# Patient Record
Sex: Female | Born: 2000 | Race: White | Hispanic: Yes | Marital: Single
Health system: Midwestern US, Community
[De-identification: ages and names within clinical notes are randomized; demographics above are authoritative.]

## PROBLEM LIST (undated history)

## (undated) DIAGNOSIS — F32A Depression, unspecified: Secondary | ICD-10-CM

## (undated) DIAGNOSIS — F419 Anxiety disorder, unspecified: Secondary | ICD-10-CM

## (undated) DIAGNOSIS — F909 Attention-deficit hyperactivity disorder, unspecified type: Secondary | ICD-10-CM

## (undated) DIAGNOSIS — J4520 Mild intermittent asthma, uncomplicated: Secondary | ICD-10-CM

## (undated) HISTORY — DX: Anxiety disorder, unspecified: F41.9

## (undated) HISTORY — PX: WISDOM TOOTH EXTRACTION: SHX21

## (undated) HISTORY — PX: TONSILLECTOMY: SUR1361

## (undated) MED ORDER — ALBUTEROL SULFATE HFA 90 MCG/ACTUATION AEROSOL INHALER
90 mcg/actuation | RESPIRATORY_TRACT | Status: DC | PRN
Start: ? — End: 2012-11-18

## (undated) MED ORDER — ALBUTEROL SULFATE HFA 90 MCG/ACTUATION AEROSOL INHALER
90 mcg/actuation | RESPIRATORY_TRACT | Status: DC | PRN
Start: ? — End: 2013-08-11

---
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
## 2022-02-20 DIAGNOSIS — R109 Unspecified abdominal pain: Secondary | ICD-10-CM | POA: Diagnosis not present

## 2022-02-27 ENCOUNTER — Ambulatory Visit: Payer: Medicaid Other | Admitting: Family Medicine

## 2022-03-07 ENCOUNTER — Ambulatory Visit: Payer: Medicaid Other | Admitting: Family Medicine

## 2022-04-07 ENCOUNTER — Encounter: Payer: Self-pay | Admitting: Family Medicine

## 2022-04-07 ENCOUNTER — Ambulatory Visit: Payer: BC Managed Care – PPO | Admitting: Family Medicine

## 2022-04-07 VITALS — BP 107/68 | HR 81 | Temp 97.5°F | Resp 20 | Ht 67.0 in | Wt 228.0 lb

## 2022-04-07 DIAGNOSIS — M25561 Pain in right knee: Secondary | ICD-10-CM

## 2022-04-07 DIAGNOSIS — B070 Plantar wart: Secondary | ICD-10-CM

## 2022-04-07 DIAGNOSIS — R109 Unspecified abdominal pain: Secondary | ICD-10-CM | POA: Diagnosis not present

## 2022-04-07 MED ORDER — NAPROXEN 375 MG PO TBEC: 375.0000 mg | DELAYED_RELEASE_TABLET | Freq: Two times a day (BID) | ORAL | 2 refills | Status: DC | PRN

## 2022-04-07 NOTE — Progress Notes (Signed)
Subjective: CC: Follow-up GI cramping PCP: Raliegh Ip, DO Kari Randall is a 21 y.o. female presenting to clinic today for:  1.  Abdominal cramping Patient has seen gastroenterology.  She was treated with Bentyl and fiber and advised to return to clinic in 8 weeks.  She apparently missed her appointment but has rescheduled.  Notes total resolution of cramping and change in bowel habits.  2.  Knee pain Patient reports that she has been experiencing some intermittent knee pain on the right side.  This seemed to get worse after she started having to work a new loop at regular.  Pivoting motions tend to aggravate it.  She used a brace but it keeps rolling down.  Also applied Voltaren gel but did not find that to be especially helpful.  Has not tried the Naprosyn that she was prescribed for migraines because she left that medicine at home.  Occasionally pops but does not lock.  No giving way or weakness  3.  Foot pain Patient reports a spot on her right foot on the bottom.  This has been there for over 6 months.  She thought she in fact had a splinter of some sort so tried to take something out but noted that it never really resolved and has this raised area now.   ROS: Per HPI  Allergies  Allergen Reactions   Amitriptyline Rash        Amoxicillin Diarrhea and Rash   Past Medical History:  Diagnosis Date   Anxiety    Attention deficit hyperactivity disorder (ADHD)    Depression     Current Outpatient Medications:    diclofenac Sodium (VOLTAREN) 1 % GEL, Apply 4 g topically 4 (four) times daily as needed (knee pain)., Disp: 300 g, Rfl: 2   levocetirizine (XYZAL) 5 MG tablet, Take 1 tablet (5 mg total) by mouth every evening. For allergies/ ears, Disp: 90 tablet, Rfl: 3   medroxyPROGESTERone (DEPO-PROVERA) 150 MG/ML injection, Inject 150 mg into the muscle every 3 (three) months., Disp: , Rfl:    Naproxen 375 MG TBEC, Take 1 tablet (375 mg total) by mouth 2 (two) times  daily as needed (migraine headache)., Disp: 60 tablet, Rfl: 0   ondansetron (ZOFRAN-ODT) 4 MG disintegrating tablet, Take 1 tablet (4 mg total) by mouth every 8 (eight) hours as needed for nausea or vomiting. (Patient not taking: Reported on 04/07/2022), Disp: 20 tablet, Rfl: 0 Social History   Socioeconomic History   Marital status: Single    Spouse name: Not on file   Number of children: Not on file   Years of education: Not on file   Highest education level: Not on file  Occupational History   Not on file  Tobacco Use   Smoking status: Never    Passive exposure: Yes   Smokeless tobacco: Never   Tobacco comments:    1-2 cigarettes a day   Vaping Use   Vaping Use: Former   Substances: Nicotine  Substance and Sexual Activity   Alcohol use: No   Drug use: Not Currently    Types: Marijuana    Comment: last smoked- before preg   Sexual activity: Not Currently    Birth control/protection: None  Other Topics Concern   Not on file  Social History Narrative   Not on file   Social Determinants of Health   Financial Resource Strain: Low Risk  (08/04/2019)   Overall Financial Resource Strain (CARDIA)    Difficulty of Paying Living  Expenses: Not very hard  Food Insecurity: No Food Insecurity (08/04/2019)   Hunger Vital Sign    Worried About Running Out of Food in the Last Year: Never true    Ran Out of Food in the Last Year: Never true  Transportation Needs: No Transportation Needs (08/04/2019)   PRAPARE - Administrator, Civil Service (Medical): No    Lack of Transportation (Non-Medical): No  Physical Activity: Sufficiently Active (08/04/2019)   Exercise Vital Sign    Days of Exercise per Week: 5 days    Minutes of Exercise per Session: 30 min  Stress: No Stress Concern Present (08/04/2019)   [employer] of Occupational Health - Occupational Stress Questionnaire    Feeling of Stress : Only a little  Social Connections: Moderately Isolated (08/04/2019)   Social  Connection and Isolation Panel [NHANES]    Frequency of Communication with Friends and Family: More than three times a week    Frequency of Social Gatherings with Friends and Family: Twice a week    Attends Religious Services: 1 to 4 times per year    Active Member of [employer] or Organizations: No    Attends Banker Meetings: Never    Marital Status: Never married  Intimate Partner Violence: Not At Risk (08/04/2019)   Humiliation, Afraid, Rape, and Kick questionnaire    Fear of Current or Ex-Partner: No    Emotionally Abused: No    Physically Abused: No    Sexually Abused: No   Family History  Problem Relation Age of Onset   Thyroid disease Mother    Anemia Mother     Objective: Office vital signs reviewed. BP 107/68   Pulse 81   Temp (!) 97.5 F (36.4 C) (Temporal)   Resp 20   Ht 5\' 7"  (1.702 m)   Wt 228 lb (103.4 kg)   SpO2 96%   BMI 35.71 kg/m   Physical Examination:  General: Awake, alert, well nourished, No acute distress HEENT: sclera white, MMM MSK: Ambulating with normal gait and station.  No tenderness palpation to the patellar tendon, patella, joint line, posterior popliteal fossa.  No appreciable joint effusion or erythema or warmth. Skin: Plantar wart noted along the right plantar space at just below the third MTP  Cryotherapy Procedure:  Risks and benefits of procedure were reviewed with the patient.  Written consent obtained and scanned into the chart.  Lesion of concern was identified and located on right foot.  Liquid nitrogen was applied to area of concern and extending out 1 millimeters beyond the border of the lesion.  Treated area was allowed to come back to room temperature before treating it a second time.  Patient tolerated procedure well and there were no immediate complications.  Home care instructions were reviewed with the patient and a handout was provided.  Assessment/ Plan: 21 y.o. female   Plantar wart of right foot  Acute pain  of right knee - Plan: Naproxen 375 MG TBEC  Abdominal cramping  Plantar wart treated with cryoablation.  Home care instructions reviewed and reasons for reevaluation discussed.  Follow-up as needed  Suspect that the pain in the knee is secondary to either tendinitis or maybe even some meniscal irritation.  It seems to be worse with pivoting so I suspect the latter.  Will give her Naprosyn to use up to twice daily as needed.  Use ice to the affected area.  Okay to use brace.  Follow-up as needed  Abdominal cramping has  resolved status post treatment by GI.  Will CC chart as FYI to them  No orders of the defined types were placed in this encounter.  No orders of the defined types were placed in this encounter.    Raliegh Ip, DO [facility] [phone]

## 2022-04-07 NOTE — Patient Instructions (Signed)
  Plantar Warts Plantar warts are small growths on the bottom of the foot (sole). Warts are caused by a type of germ (virus). Most warts are not painful, and they usually do not cause problems. Sometimes, plantar warts can cause pain when you walk. Warts often go away on their own in time. They can also spread to other areas of the body. Treatments may be done if needed. What are the causes? Plantar warts are caused by a germ that is called human papillomavirus (HPV). Walking barefoot can cause exposure to the germ, especially if your feet are wet. Warts happen when HPV attacks a break in the skin of the foot. What increases the risk? Being between 10 and 20 years of age. Using public showers or locker rooms. Having a weakened body defense system (immune system). What are the signs or symptoms?  Flat or slightly raised growths that have a rough surface and look like a callus. Pain when you use your foot to support your body weight. How is this treated? In many cases, warts do not need treatment. Without treatment, they often go away with time. If treatment is needed or wanted, options may include: Applying medicated solutions, creams, or patches to the wart. These make the skin soft so that layers will slowly shed away. Freezing the wart with liquid nitrogen (cryotherapy). Burning the wart with: Laser treatment. An electrified probe (electrocautery). Injecting a medicine (Candida antigen) into the wart to help the body's defense system fight off the wart. Having surgery to remove the wart. Putting duct tape over the top of the wart (occlusion). You will leave the tape in place for as long as told by your doctor. Then you will replace it with a new strip of tape. This is done until the wart goes away. Repeat treatment may be needed if you choose to remove warts. Warts sometimes go away and come back again. Follow these instructions at home: General instructions Apply creams or  solutions only as told by your doctor. Follow these steps if your doctor tells you to do so: Soak your foot in warm water. Remove the top layer of softened skin before you apply the medicine. You can use a pumice stone to remove the skin. After you apply the medicine, put a bandage over the area of the wart. Repeat the process every day or as told by your doctor. Do not scratch or pick at a wart. Wash your hands after you touch a wart. If a wart hurts, try covering it with a bandage that has a hole in the middle. Keep all follow-up visits as told by your doctor. This is important. How is this prevented?  Wear shoes and socks. Change your socks every day. Keep your feet clean and dry. Check your feet often. Do not walk barefoot in: Shared locker rooms. Shower areas. Swimming pools. Avoid direct contact with warts on other people. Contact a doctor if: Your warts do not improve after treatment. You have redness, swelling, or pain at the site of a wart. You have bleeding from a wart, and the bleeding does not stop when you put light pressure on the wart. You have diabetes and you get a wart. Summary Warts are small growths on the skin. When warts happen on the bottom of the foot (sole), they are called plantar warts. In many cases, warts do not need treatment. Apply creams or solutions only as told by your doctor. Do not scratch or pick at a wart. Wash   your hands after you touch a wart. This information is not intended to replace advice given to you by your health care provider. Make sure you discuss any questions you have with your health care provider. Document Revised: 07/15/2020 Document Reviewed: 07/15/2020 Elsevier Patient Education  2023 Elsevier Inc.  

## 2022-05-25 DIAGNOSIS — R519 Headache, unspecified: Secondary | ICD-10-CM | POA: Diagnosis not present

## 2022-05-25 DIAGNOSIS — R110 Nausea: Secondary | ICD-10-CM | POA: Diagnosis not present

## 2022-05-25 DIAGNOSIS — Z880 Allergy status to penicillin: Secondary | ICD-10-CM | POA: Diagnosis not present

## 2022-05-25 DIAGNOSIS — F17290 Nicotine dependence, other tobacco product, uncomplicated: Secondary | ICD-10-CM | POA: Diagnosis not present

## 2022-05-26 ENCOUNTER — Telehealth: Payer: Self-pay

## 2022-05-26 NOTE — Transitions of Care (Post Inpatient/ED Visit) (Signed)
   05/26/2022  Name: Kari Randall MRN: [id number] DOB: October 20, 2000  Today's TOC FU Call Status: Today's TOC FU Call Status:: Successful TOC FU Call Competed TOC FU Call Complete Date: 05/26/22  Transition Care Management Follow-up Telephone Call Date of Discharge: 05/25/22 Discharge Facility: Other Facility Name of Other Discharge Facility: [facility] Type of Discharge: Emergency Department Reason for ED Visit:  (headache) How have you been since you were released from the hospital?: Better Any questions or concerns?: No  Items Reviewed: Did you receive and understand the discharge instructions provided?: Yes Medications obtained and verified?: Yes (Medications Reviewed) Any new allergies since your discharge?: No Dietary orders reviewed?: NA Do you have support at home?: Yes People in Home: parent(s)  Home Care and Equipment/Supplies: [place] Ordered?: NA Any new equipment or medical supplies ordered?: NA  Functional Questionnaire: Do you need assistance with bathing/showering or dressing?: No Do you need assistance with meal preparation?: No Do you need assistance with eating?: No Do you have difficulty maintaining continence: No Do you need assistance with getting out of bed/getting out of a chair/moving?: No Do you have difficulty managing or taking your medications?: No  Folllow up appointments reviewed: PCP Follow-up appointment confirmed?: NA Specialist Hospital Follow-up appointment confirmed?: NA Do you need transportation to your follow-up appointment?: No Do you understand care options if your condition(s) worsen?: Yes-patient verbalized understanding    [place], [place] Direct Dial [phone]

## 2022-06-02 ENCOUNTER — Encounter: Payer: Self-pay | Admitting: Family Medicine

## 2022-06-06 ENCOUNTER — Telehealth (INDEPENDENT_AMBULATORY_CARE_PROVIDER_SITE_OTHER): Payer: BC Managed Care – PPO | Admitting: Family Medicine

## 2022-06-06 ENCOUNTER — Telehealth: Payer: Self-pay | Admitting: Family Medicine

## 2022-06-06 DIAGNOSIS — G43009 Migraine without aura, not intractable, without status migrainosus: Secondary | ICD-10-CM | POA: Diagnosis not present

## 2022-06-06 MED ORDER — SUMATRIPTAN SUCCINATE 50 MG PO TABS: 50.0000 mg | ORAL_TABLET | ORAL | 0 refills | Status: DC | PRN

## 2022-06-06 MED ORDER — ONDANSETRON 4 MG PO TBDP: 4.0000 mg | ORAL_TABLET | Freq: Three times a day (TID) | ORAL | 0 refills | Status: DC | PRN

## 2022-06-06 MED ORDER — NURTEC 75 MG PO TBDP: 1.0000 | ORAL_TABLET | ORAL | 12 refills | Status: DC

## 2022-06-06 NOTE — Telephone Encounter (Signed)
Attempted to call pt, no answer. 

## 2022-06-06 NOTE — Progress Notes (Signed)
MyChart Video visit  Subjective: CC:Migraine PCP: Janora Norlander, DO [id number] Kari Randall is a 22 y.o. female. Patient provides verbal consent for consult held via video.  Due to COVID-19 pandemic this visit was conducted virtually. This visit type was conducted due to national recommendations for restrictions regarding the COVID-19 Pandemic (e.g. social distancing, sheltering in place) in an effort to limit this patient's exposure and mitigate transmission in our community. All issues noted in this document were discussed and addressed.  A physical exam was not performed with this format.   Location of patient: home Location of provider: [facility] Others present for call: son  1. Migraine She reports a bad migraine last week.  On Wednesday, she woke up and it became severe and was paralyzing.  She thought maybe she had COVID and it was negative x2.  Seen in ER and it was diagnosed as a migraine.  She has been on TCA as a preventative but this was not helpful. She reports photophobia and blurred vision with the headache and she did become nauseated last time she had a migraine.   ROS: Per HPI  Allergies  Allergen Reactions   Amitriptyline Rash        Amoxicillin Diarrhea and Rash   Past Medical History:  Diagnosis Date   Anxiety    Attention deficit hyperactivity disorder (ADHD)    Depression     Current Outpatient Medications:    diclofenac Sodium (VOLTAREN) 1 % GEL, Apply 4 g topically 4 (four) times daily as needed (knee pain)., Disp: 300 g, Rfl: 2   levocetirizine (XYZAL) 5 MG tablet, Take 1 tablet (5 mg total) by mouth every evening. For allergies/ ears, Disp: 90 tablet, Rfl: 3   medroxyPROGESTERone (DEPO-PROVERA) 150 MG/ML injection, Inject 150 mg into the muscle every 3 (three) months., Disp: , Rfl:    Naproxen 375 MG TBEC, Take 1 tablet (375 mg total) by mouth 2 (two) times daily as needed (knee pain)., Disp: 60 tablet, Rfl: 2   ondansetron (ZOFRAN-ODT) 4 MG disintegrating  tablet, Take 1 tablet (4 mg total) by mouth every 8 (eight) hours as needed for nausea or vomiting., Disp: 20 tablet, Rfl: 0  Gen: well appearing female, NAD Neuro: no focal deficits appreciated. AOx3  Assessment/ Plan: 22 y.o. female   Migraine without aura and without status migrainosus, not intractable - Plan: metoCLOPramide (REGLAN) 10 MG tablet, Rimegepant Sulfate (NURTEC) 75 MG TBDP, SUMAtriptan (IMITREX) 50 MG tablet, ondansetron (ZOFRAN-ODT) 4 MG disintegrating tablet  Start Nurtec every other day as a prevention measure.  I have given her Imitrex for as needed use if needed for breakthrough migraines.  Zofran renewed if needed for nausea.  Will plan to follow-up in office in the next couple of months, sooner if concerns arise.  Appointment has been scheduled.  Alternatives considered as prophylactic includes injection therapy as she is been intolerant to TCAs in the past.  Start time: 11:31a End time: 11:39a  Total time spent on patient care (including video visit/ documentation): 5 minutes  [place], [place] [phone]

## 2022-06-06 NOTE — Telephone Encounter (Signed)
The only other option is injectable. She could not tolerate TCA and BP is too low for propranolol.  If she doesn't have an active migraine, she can just use the Imitrex as needed for now and follow up as scheduled.  If she is having more than 4 migraines per month, we might be able to get Emgality injectable or similar approved

## 2022-06-13 ENCOUNTER — Telehealth: Payer: Self-pay

## 2022-06-13 DIAGNOSIS — G43009 Migraine without aura, not intractable, without status migrainosus: Secondary | ICD-10-CM

## 2022-06-13 NOTE — Telephone Encounter (Signed)
Wynn Banker (Key: [id number]) PA Case ID #: [id number] Rx #: [id number] Need Help? Call us at [phone] Status sent iconSent to Plan today Drug Nurtec '75MG'$  dispersible tablets ePA cloud Probation officer PA Form [id number] NCPDP)

## 2022-06-20 NOTE — Telephone Encounter (Signed)
Kari Randall, not sure if [place] ever responded but if she wishes to proceed with injectable, let me know

## 2022-06-20 NOTE — Telephone Encounter (Signed)
Outcome Denied on March 11 PA Case: [id number], Status: Denied. Notification:

## 2022-06-24 MED ORDER — EMGALITY 120 MG/ML ~~LOC~~ SOAJ: 120.0000 mg | SUBCUTANEOUS | 99 refills | Status: DC

## 2022-06-24 MED ORDER — EMGALITY 120 MG/ML ~~LOC~~ SOAJ: 240.0000 mg | Freq: Once | SUBCUTANEOUS | 0 refills | Status: AC

## 2022-06-27 ENCOUNTER — Telehealth: Payer: Self-pay | Admitting: Family Medicine

## 2022-07-04 NOTE — Telephone Encounter (Signed)
Emgality was denied.  Must try and fail or intolerance to two agents for migraine prophylaxis (at least one agent in any two of the following classes):  Antidepressants (amitriptyline, venlafaxine, nortriptyline, duloxetine) Beta blockers (metoprolol, propranolol, timolol oral, nadolol, atenolol) Calcium channel blocker (verapamil) Antiepileptic agenst (valproate sodium, divalproex socium, topiramate, gabapentin) Botox  Patient has tried and failed Amitriptyline and is unable to take Beta Blockers or Calcium Channel Blockers because of hypotension.  Appeal sent to cover my meds  Fax sent to the plan Your PA has been faxed to the plan as a paper copy. Please contact the plan directly if you haven't received a determination in a typical timeframe.  You will be notified of the determination electronically and via fax. How do I know if the plan approved the PA?  Add Reminder to your Dashboard Remind me in:  5 business days Contact plan to follow up on [id number]

## 2022-07-05 NOTE — Telephone Encounter (Signed)
Patient would like to try pended referral.

## 2022-07-05 NOTE — Telephone Encounter (Signed)
Please see my last note.  She has FAILED TCA, this is amitriptyline.  Cannot take Betablockers or calcium channel due to low normal BP.  She has not tried botox so if she wants referral to neurology, I can place and they can offer that but we don't perform that here.

## 2022-07-05 NOTE — Addendum Note (Signed)
Addended by: Ladean Raya on: 07/05/2022 03:32 PM   Modules accepted: Orders

## 2022-07-07 NOTE — Telephone Encounter (Signed)
Received notification from [facility] that the prior authorization for Emgality has been denied.

## 2022-07-10 ENCOUNTER — Other Ambulatory Visit: Payer: Self-pay | Admitting: Family Medicine

## 2022-07-10 ENCOUNTER — Ambulatory Visit: Payer: BC Managed Care – PPO | Admitting: Family Medicine

## 2022-07-10 DIAGNOSIS — G43009 Migraine without aura, not intractable, without status migrainosus: Secondary | ICD-10-CM

## 2022-08-15 ENCOUNTER — Telehealth: Payer: Self-pay

## 2022-08-15 NOTE — Telephone Encounter (Signed)
Kari Randall (Kari Randall) Rx #: [id number] Emgality 120MG /ML auto-injectors (migraine) Form Photographer PA Form [phone] NCPDP) Created 1 day ago Sent to Plan 4 minutes ago Plan Response 4 minutes ago Submit Clinical Questions less than a minute ago Determination Wait for Determination Please wait for [employer] 2017 to return a determination.

## 2022-08-16 ENCOUNTER — Other Ambulatory Visit (HOSPITAL_COMMUNITY): Payer: Self-pay

## 2022-08-16 NOTE — Telephone Encounter (Signed)
Patient Advocate Encounter  Prior Authorization for [employer] 120MG /ML auto-injectors (migraine) has been approved with [employer].    PA#  [id number] Effective dates: 08/15/22 through 09/12/22  Per [facility] test claim, copay for 30 days supply is $0

## 2022-09-01 ENCOUNTER — Ambulatory Visit: Payer: BC Managed Care – PPO | Admitting: Diagnostic Neuroimaging

## 2022-09-01 ENCOUNTER — Encounter: Payer: Self-pay | Admitting: Diagnostic Neuroimaging

## 2022-10-18 ENCOUNTER — Encounter: Payer: BC Managed Care – PPO | Admitting: Family Medicine

## 2023-07-15 ENCOUNTER — Other Ambulatory Visit: Payer: Self-pay

## 2023-07-15 ENCOUNTER — Encounter (HOSPITAL_BASED_OUTPATIENT_CLINIC_OR_DEPARTMENT_OTHER): Payer: Self-pay | Admitting: Emergency Medicine

## 2023-07-15 ENCOUNTER — Emergency Department (HOSPITAL_BASED_OUTPATIENT_CLINIC_OR_DEPARTMENT_OTHER)
Admission: EM | Admit: 2023-07-15 | Discharge: 2023-07-15 | Disposition: A | Discharge status: Home / Self Care | Attending: Emergency Medicine | Admitting: Emergency Medicine

## 2023-07-15 DIAGNOSIS — E876 Hypokalemia: Secondary | ICD-10-CM | POA: Diagnosis not present

## 2023-07-15 DIAGNOSIS — R253 Fasciculation: Secondary | ICD-10-CM | POA: Diagnosis not present

## 2023-07-15 LAB — CBC WITH DIFFERENTIAL/PLATELET
Abs Immature Granulocytes: 0.01 10*3/uL (ref 0.00–0.07)
Basophils Absolute: 0.1 10*3/uL (ref 0.0–0.1)
Basophils Relative: 1 %
Eosinophils Absolute: 0.2 10*3/uL (ref 0.0–0.5)
Eosinophils Relative: 2 %
HCT: 35.5 % — ABNORMAL LOW (ref 36.0–46.0)
Hemoglobin: 11.8 g/dL — ABNORMAL LOW (ref 12.0–15.0)
Immature Granulocytes: 0 %
Lymphocytes Relative: 33 %
Lymphs Abs: 2.6 10*3/uL (ref 0.7–4.0)
MCH: 27.8 pg (ref 26.0–34.0)
MCHC: 33.2 g/dL (ref 30.0–36.0)
MCV: 83.7 fL (ref 80.0–100.0)
Monocytes Absolute: 0.5 10*3/uL (ref 0.1–1.0)
Monocytes Relative: 6 %
Neutro Abs: 4.7 10*3/uL (ref 1.7–7.7)
Neutrophils Relative %: 58 %
Platelets: 251 10*3/uL (ref 150–400)
RBC: 4.24 MIL/uL (ref 3.87–5.11)
RDW: 13.7 % (ref 11.5–15.5)
WBC: 8.1 10*3/uL (ref 4.0–10.5)
nRBC: 0 % (ref 0.0–0.2)

## 2023-07-15 LAB — COMPREHENSIVE METABOLIC PANEL WITH GFR
ALT: 10 U/L (ref 0–44)
AST: 12 U/L — ABNORMAL LOW (ref 15–41)
Albumin: 4 g/dL (ref 3.5–5.0)
Alkaline Phosphatase: 77 U/L (ref 38–126)
Anion gap: 6 (ref 5–15)
BUN: 8 mg/dL (ref 6–20)
CO2: 27 mmol/L (ref 22–32)
Calcium: 9 mg/dL (ref 8.9–10.3)
Chloride: 106 mmol/L (ref 98–111)
Creatinine, Ser: 0.72 mg/dL (ref 0.44–1.00)
GFR, Estimated: >60 mL/min (ref >60)
Glucose, Bld: 120 mg/dL — ABNORMAL HIGH (ref 70–99)
Potassium: 3.2 mmol/L — ABNORMAL LOW (ref 3.5–5.1)
Sodium: 139 mmol/L (ref 135–145)
Total Bilirubin: 0.4 mg/dL (ref 0.0–1.2)
Total Protein: 6.5 g/dL (ref 6.5–8.1)

## 2023-07-15 LAB — HCG, SERUM, QUALITATIVE: Preg, Serum: NEGATIVE

## 2023-07-15 LAB — CBG MONITORING, ED: Glucose-Capillary: 96 mg/dL (ref 70–99)

## 2023-07-15 LAB — MAGNESIUM: Magnesium: 2.1 mg/dL (ref 1.7–2.4)

## 2023-07-15 MED ORDER — POTASSIUM CHLORIDE CRYS ER 20 MEQ PO TBCR
40.0000 meq | EXTENDED_RELEASE_TABLET | Freq: Once | ORAL | Status: AC
  Administered 2023-07-15: 40 meq via ORAL
  Filled 2023-07-15: qty 2

## 2023-07-15 NOTE — ED Notes (Signed)
 Pt discharged home after verbalizing understanding of discharge instructions; nad noted.

## 2023-07-15 NOTE — Discharge Instructions (Signed)
 Workup here without any significant findings calcium magnesium was normal.  Potassium was just a little bit low gave you some oral potassium supplement here.  Follow back up with your regular doctor to have potassium rechecked.  Return for any new or worse symptoms.

## 2023-07-15 NOTE — ED Triage Notes (Addendum)
 While laying on her couch right hand went numb and locked up, she was laying on right hand. Lasted about 10 minutes Report twitching of face, "I can't really move face"  Reviewed s/s with provider at time of triage

## 2023-07-15 NOTE — ED Provider Notes (Addendum)
 [place] EMERGENCY DEPARTMENT AT [facility] Provider Note   CSN: [id number] Arrival date & time: 07/15/23  1711     History  No chief complaint on file.   Kari Randall is a 23 y.o. female.  Patient proximally about an hour prior to arrival was laying on the couch.  Leaning kind of on her right elbow hand went numb but then her fingers contracted as well.  Following that she had some twitching of the right side of her face.  All of this may be lasted 10 minutes or less.  She did also note with the blood pressure cuff on her left arm and that went up her fingers with contract on the left hand.  Otherwise completely asymptomatic.  Patient does have a history of migraines and is on medication for that but nothing is new.  Has a history of attention attention deficit disorder anxiety depression and migraines.  Patient does not use tobacco products.       Home Medications Prior to Admission medications   Medication Sig Start Date End Date Taking? Authorizing Provider  diclofenac Sodium (VOLTAREN) 1 % GEL Apply 4 g topically 4 (four) times daily as needed (knee pain). 10/24/21   Delynn Flavin M, DO  Galcanezumab-gnlm (EMGALITY) 120 MG/ML SOAJ Inject 120 mg into the skin every 28 (twenty-eight) days. Start Month #2 for migraine prevention 06/24/22   Raliegh Ip, DO  levocetirizine (XYZAL) 5 MG tablet Take 1 tablet (5 mg total) by mouth every evening. For allergies/ ears 07/25/21   Delynn Flavin M, DO  medroxyPROGESTERone (DEPO-PROVERA) 150 MG/ML injection Inject 150 mg into the muscle every 3 (three) months.    [provider]  metoCLOPramide (REGLAN) 10 MG tablet TAKE 1 TABLET BY MOUTH EVERY 8 HOURS AS NEEDED FOR RECURRENT HEADACHES FOR UP TO 3 DAYS. TAKE IN CONJUNCTION WITH BENADRYL 50MG  AT THE EARLIEST SIGN OF ANY RECURRENT HEACACHES FOR UP TO 3 TIMES DAILY EVERY 8 HOURS APART AS NEEDED 05/26/22   [provider]  MY WAY 1.5 MG tablet Take 1.5 mg by  mouth once. prn 03/30/22   [provider]  Naproxen 375 MG TBEC Take 1 tablet (375 mg total) by mouth 2 (two) times daily as needed (knee pain). 04/07/22   Raliegh Ip, DO  ondansetron (ZOFRAN-ODT) 4 MG disintegrating tablet Take 1 tablet (4 mg total) by mouth every 8 (eight) hours as needed for nausea or vomiting. 06/06/22   Delynn Flavin M, DO  Rimegepant Sulfate (NURTEC) 75 MG TBDP Take 1 tablet (75 mg total) by mouth every other day. For migraine prevention 06/06/22   Delynn Flavin M, DO  SUMAtriptan (IMITREX) 50 MG tablet Take 1 tablet (50 mg total) by mouth every 2 (two) hours as needed for migraine. May repeat ONCE in 2 hours if headache persists or recurs. 06/06/22   Raliegh Ip, DO      Allergies    Amitriptyline and Amoxicillin    Review of Systems   Review of Systems  Constitutional:  Negative for chills and fever.  HENT:  Negative for ear pain and sore throat.   Eyes:  Negative for pain and visual disturbance.  Respiratory:  Negative for cough and shortness of breath.   Cardiovascular:  Negative for chest pain and palpitations.  Gastrointestinal:  Negative for abdominal pain and vomiting.  Genitourinary:  Negative for dysuria and hematuria.  Musculoskeletal:  Negative for arthralgias and back pain.  Skin:  Negative for color change  and rash.  Neurological:  Positive for facial asymmetry and numbness. Negative for seizures and syncope.  All other systems reviewed and are negative.   Physical Exam Updated Vital Signs BP 121/69 (BP Location: Left Arm)   Pulse 88   Temp 98.7 F (37.1 C) (Oral)   Resp 18   SpO2 100%  Physical Exam Vitals and nursing note reviewed.  Constitutional:      General: She is not in acute distress.    Appearance: Normal appearance. She is well-developed.  HENT:     Head: Normocephalic and atraumatic.     Mouth/Throat:     Mouth: Mucous membranes are moist.  Eyes:     Extraocular Movements: Extraocular movements  intact.     Conjunctiva/sclera: Conjunctivae normal.     Pupils: Pupils are equal, round, and reactive to light.  Cardiovascular:     Rate and Rhythm: Normal rate and regular rhythm.     Heart sounds: No murmur heard. Pulmonary:     Effort: Pulmonary effort is normal. No respiratory distress.     Breath sounds: Normal breath sounds.  Abdominal:     Palpations: Abdomen is soft.     Tenderness: There is no abdominal tenderness.  Musculoskeletal:        General: No swelling.     Cervical back: Normal range of motion and neck supple. No rigidity.  Skin:    General: Skin is warm and dry.     Capillary Refill: Capillary refill takes less than 2 seconds.  Neurological:     General: No focal deficit present.     Mental Status: She is alert and oriented to person, place, and time.     Cranial Nerves: No cranial nerve deficit.     Sensory: No sensory deficit.     Motor: No weakness.     Comments: No abnormalities currently.  Negative itching of face with tapping along the facial nerve  Psychiatric:        Mood and Affect: Mood normal.     ED Results / Procedures / Treatments   Labs (all labs ordered are listed, but only abnormal results are displayed) Labs Reviewed  CBC WITH DIFFERENTIAL/PLATELET - Abnormal; Notable for the following components:      Result Value   Hemoglobin 11.8 (*)    HCT 35.5 (*)    All other components within normal limits  COMPREHENSIVE METABOLIC PANEL WITH GFR - Abnormal; Notable for the following components:   Potassium 3.2 (*)    Glucose, Bld 120 (*)    AST 12 (*)    All other components within normal limits  MAGNESIUM  HCG, SERUM, QUALITATIVE  CBG MONITORING, ED    EKG None  Radiology No results found.  Procedures Procedures    Medications Ordered in ED Medications  potassium chloride SA (KLOR-CON M) CR tablet 40 mEq (has no administration in time range)    ED Course/ Medical Decision Making/ A&P                                  Medical Decision Making Amount and/or Complexity of Data Reviewed Labs: ordered.  Risk Prescription drug management.   Based on patient's symptoms could possibly be hypocalcemic.  She will take calcium and magnesium.  Niccoli this does not sound like a stroke.  So no concerns for CVA or need for stroke protocol.  Patient's lab workup CBC normal pregnancy test negative.  Potassium a little low at 3.2 will give 40 mill equivalents of potassium here renal function was normal.  Patient's calcium was normal and magnesium was normal.  Patient is neuroexam here was completely normal.  I will have her follow-up with her primary care doctor to have potassium rechecked.  No acute findings to explain the symptoms.  Again does not seem to be consistent with any strokelike symptoms.   Final Clinical Impression(s) / ED Diagnoses Final diagnoses:  Hypokalemia    Rx / DC Orders ED Discharge Orders     None         Vanetta Mulders, MD 07/15/23 Mallie Snooks    Vanetta Mulders, MD 07/15/23 Margretta Ditty    Vanetta Mulders, MD 07/15/23 [id number]

## 2023-07-17 ENCOUNTER — Encounter: Payer: Self-pay | Admitting: Family Medicine

## 2023-07-17 ENCOUNTER — Ambulatory Visit: Admitting: Family Medicine

## 2023-07-17 NOTE — Progress Notes (Deleted)
 Subjective: CC:AUB PCP: Raliegh Ip, DO NGE:XBMWUX Kari Randall is a 23 y.o. female presenting to clinic today for:  1. Abnormal Uterine Bleeding ***   ROS: Per HPI  Allergies  Allergen Reactions   Amitriptyline Rash        Amoxicillin Diarrhea and Rash   Past Medical History:  Diagnosis Date   Anxiety    Attention deficit hyperactivity disorder (ADHD)    Depression     Current Outpatient Medications:    diclofenac Sodium (VOLTAREN) 1 % GEL, Apply 4 g topically 4 (four) times daily as needed (knee pain)., Disp: 300 g, Rfl: 2   Galcanezumab-gnlm (EMGALITY) 120 MG/ML SOAJ, Inject 120 mg into the skin every 28 (twenty-eight) days. Start Month #2 for migraine prevention, Disp: 1.12 mL, Rfl: PRN   levocetirizine (XYZAL) 5 MG tablet, Take 1 tablet (5 mg total) by mouth every evening. For allergies/ ears, Disp: 90 tablet, Rfl: 3   medroxyPROGESTERone (DEPO-PROVERA) 150 MG/ML injection, Inject 150 mg into the muscle every 3 (three) months., Disp: , Rfl:    metoCLOPramide (REGLAN) 10 MG tablet, TAKE 1 TABLET BY MOUTH EVERY 8 HOURS AS NEEDED FOR RECURRENT HEADACHES FOR UP TO 3 DAYS. TAKE IN CONJUNCTION WITH BENADRYL 50MG  AT THE EARLIEST SIGN OF ANY RECURRENT HEACACHES FOR UP TO 3 TIMES DAILY EVERY 8 HOURS APART AS NEEDED, Disp: , Rfl:    MY WAY 1.5 MG tablet, Take 1.5 mg by mouth once. prn, Disp: , Rfl:    Naproxen 375 MG TBEC, Take 1 tablet (375 mg total) by mouth 2 (two) times daily as needed (knee pain)., Disp: 60 tablet, Rfl: 2   ondansetron (ZOFRAN-ODT) 4 MG disintegrating tablet, Take 1 tablet (4 mg total) by mouth every 8 (eight) hours as needed for nausea or vomiting., Disp: 20 tablet, Rfl: 0   Rimegepant Sulfate (NURTEC) 75 MG TBDP, Take 1 tablet (75 mg total) by mouth every other day. For migraine prevention, Disp: 16 tablet, Rfl: 12   SUMAtriptan (IMITREX) 50 MG tablet, Take 1 tablet (50 mg total) by mouth every 2 (two) hours as needed for migraine. May repeat ONCE in 2  hours if headache persists or recurs., Disp: 10 tablet, Rfl: 0 Social History   Socioeconomic History   Marital status: Single    Spouse name: Not on file   Number of children: Not on file   Years of education: Not on file   Highest education level: Not on file  Occupational History   Not on file  Tobacco Use   Smoking status: Never    Passive exposure: Yes   Smokeless tobacco: Never   Tobacco comments:    1-2 cigarettes a day   Vaping Use   Vaping status: Former   Substances: Nicotine  Substance and Sexual Activity   Alcohol use: No   Drug use: Not Currently    Types: Marijuana    Comment: last smoked- before preg   Sexual activity: Not Currently    Birth control/protection: None  Other Topics Concern   Not on file  Social History Narrative   Not on file   Social Drivers of Health   Financial Resource Strain: Low Risk  (08/04/2019)   Overall Financial Resource Strain (CARDIA)    Difficulty of Paying Living Expenses: Not very hard  Food Insecurity: No Food Insecurity (08/04/2019)   Hunger Vital Sign    Worried About Running Out of Food in the Last Year: Never true    Ran Out of Food  in the Last Year: Never true  Transportation Needs: No Transportation Needs (08/04/2019)   PRAPARE - Administrator, Civil Service (Medical): No    Lack of Transportation (Non-Medical): No  Physical Activity: Sufficiently Active (08/04/2019)   Exercise Vital Sign    Days of Exercise per Week: 5 days    Minutes of Exercise per Session: 30 min  Stress: No Stress Concern Present (08/04/2019)   [employer] of Occupational Health - Occupational Stress Questionnaire    Feeling of Stress : Only a little  Social Connections: Unknown (12/30/2021)   Received from [facility]   Social Network    Social Network: Not on file  Intimate Partner Violence: Unknown (12/30/2021)   Received from [facility]   HITS    Physically Hurt: Not on file    Insult or Talk Down To: Not on file     Threaten Physical Harm: Not on file    Scream or Curse: Not on file   Family History  Problem Relation Age of Onset   Thyroid disease Mother    Anemia Mother     Objective: Office vital signs reviewed. There were no vitals taken for this visit.  Physical Examination:  General: Awake, alert, *** nourished, No acute distress HEENT: Normal    Neck: No masses palpated. No lymphadenopathy    Ears: Tympanic membranes intact, normal light reflex, no erythema, no bulging    Eyes: PERRLA, extraocular membranes intact, sclera ***    Nose: nasal turbinates moist, *** nasal discharge    Throat: moist mucus membranes, no erythema, *** tonsillar exudate.  Airway is patent Cardio: regular rate and rhythm, S1S2 heard, no murmurs appreciated Pulm: clear to auscultation bilaterally, no wheezes, rhonchi or rales; normal work of breathing on room air GI: soft, non-tender, non-distended, bowel sounds present x4, no hepatomegaly, no splenomegaly, no masses GU: external vaginal tissue ***, cervix ***, *** punctate lesions on cervix appreciated, *** discharge from cervical os, *** bleeding, *** cervical motion tenderness, *** abdominal/ adnexal masses Extremities: warm, well perfused, No edema, cyanosis or clubbing; +*** pulses bilaterally MSK: *** gait and *** station Skin: dry; intact; no rashes or lesions Neuro: *** Strength and light touch sensation grossly intact, *** DTRs ***/4  Assessment/ Plan: 23 y.o. female   Abnormal uterine bleeding (AUB)  Hypokalemia  ***   Acy Orsak Hulen Skains, DO [facility] [phone]

## 2023-07-18 ENCOUNTER — Encounter: Payer: Self-pay | Admitting: Family Medicine

## 2023-07-18 ENCOUNTER — Ambulatory Visit: Admitting: Family Medicine

## 2023-07-18 VITALS — BP 120/77 | HR 60 | Temp 97.5°F | Ht 67.0 in | Wt 224.0 lb

## 2023-07-18 DIAGNOSIS — M62838 Other muscle spasm: Secondary | ICD-10-CM | POA: Diagnosis not present

## 2023-07-18 DIAGNOSIS — R6889 Other general symptoms and signs: Secondary | ICD-10-CM | POA: Diagnosis not present

## 2023-07-18 DIAGNOSIS — N921 Excessive and frequent menstruation with irregular cycle: Secondary | ICD-10-CM

## 2023-07-18 LAB — WET PREP FOR TRICH, YEAST, CLUE
Clue Cell Exam: NEGATIVE
Trichomonas Exam: NEGATIVE
Yeast Exam: POSITIVE — AB

## 2023-07-18 LAB — PREGNANCY, URINE: Preg Test, Ur: NEGATIVE

## 2023-07-18 MED ORDER — POTASSIUM CHLORIDE CRYS ER 10 MEQ PO TBCR: 10.0000 meq | EXTENDED_RELEASE_TABLET | Freq: Every day | ORAL | 2 refills | Status: AC

## 2023-07-18 MED ORDER — FLUCONAZOLE 150 MG PO TABS
150.0000 mg | ORAL_TABLET | Freq: Every day | ORAL | 0 refills | Status: AC
Start: 2023-07-18 — End: 2023-07-23

## 2023-07-18 NOTE — Progress Notes (Signed)
 Subjective:  Patient ID: Kari Randall, female    DOB: 2000-07-21  Age: 22 y.o. MRN: [id number]  CC: Menstrual Problem (Hx of abnormal periods. Feb had two periods and last month she had two. Both last a week and are heavy. Bad period pains. Not taking birth control and not interested in taking. ) and Hand Pain (Hand pain on both hands. Right hand locking up. On and most. Mostly thumb to middle finger. )   HPI Kari Randall presents for irregular menses. On depo until 2019. Irregular periods after DC> Menses very painful after 23 yr old was born. Went back on Depo until one year ago. Since has been active without protection. Had Menses in 7/24, 12/24, 1/25 then 2 in Feb and 2 in March. Each lasts 7 days each for last two months. Very heavy. Having discharge that is mucous.      07/18/2023    2:58 PM 04/07/2022    1:35 PM 10/24/2021    2:01 PM  Depression screen PHQ 2/9  Decreased Interest 1 1 1   Down, Depressed, Hopeless 1 0 1  PHQ - 2 Score 2 1 2   Altered sleeping 3 0 0  Tired, decreased energy 3 1 2   Change in appetite 3 0 1  Feeling bad or failure about yourself  2 1 1   Trouble concentrating 3 0 0  Moving slowly or fidgety/restless 1 0 0  Suicidal thoughts 0 0 0  PHQ-9 Score 17 3 6   Difficult doing work/chores Very difficult Somewhat difficult Somewhat difficult    History Kari Randall has a past medical history of Anxiety, Attention deficit hyperactivity disorder (ADHD), and Depression.   She has a past surgical history that includes Tonsillectomy; Wisdom tooth extraction; and Cesarean section (N/A, 02/06/2020).   Her family history includes Anemia in her mother; Thyroid disease in her mother.She reports that she has never smoked. She has been exposed to tobacco smoke. She has never used smokeless tobacco. She reports that she does not currently use drugs after having used the following drugs: Marijuana. She reports that she does not drink alcohol.    ROS Review of Systems   Constitutional: Negative.  Negative for fatigue and fever.  HENT: Negative.    Eyes:  Negative for visual disturbance.  Respiratory:  Negative for shortness of breath.   Cardiovascular:  Positive for leg swelling. Negative for chest pain.  Gastrointestinal:  Negative for abdominal pain.  Genitourinary:  Positive for pelvic pain, vaginal bleeding, vaginal discharge and vaginal pain. Negative for dyspareunia, dysuria and hematuria.  Musculoskeletal:  Negative for arthralgias.    Objective:  BP 120/77   Pulse 60   Temp (!) 97.5 F (36.4 C)   Ht 5\' 7"  (1.702 m)   Wt 224 lb (101.6 kg)   LMP 07/04/2023 (Approximate)   SpO2 100%   BMI 35.08 kg/m   BP Readings from Last 3 Encounters:  07/18/23 120/77  07/15/23 117/89  04/07/22 107/68    Wt Readings from Last 3 Encounters:  07/18/23 224 lb (101.6 kg)  04/07/22 228 lb (103.4 kg) (99%, Z= 2.29)*  10/24/21 228 lb 12.8 oz (103.8 kg) (99%, Z= 2.29)*   * Growth percentiles are based on CDC (Girls, 2-20 Years) data.     Physical Exam Exam conducted with a chaperone present.  Constitutional:      Appearance: Normal appearance.  Cardiovascular:     Rate and Rhythm: Normal rate and regular rhythm.     Heart sounds: Normal heart  sounds.  Pulmonary:     Breath sounds: Normal breath sounds.  Abdominal:     Tenderness: There is no abdominal tenderness.  Genitourinary:    Exam position: Lithotomy position.     Pubic Area: No rash.      Labia:        Right: No rash, tenderness or lesion.        Left: No rash, tenderness or lesion.      Vagina: Vaginal discharge (white, thick, slight curdy appearance.) present.     Cervix: Friability present. No lesion.     Uterus: Tender (at fundus for bimanual exam).   Musculoskeletal:     Cervical back: Normal range of motion.      Assessment & Plan:  Menorrhagia with irregular cycle -     WET PREP FOR TRICH, YEAST, CLUE -     Ct Ng M genitalium NAA, Urine -     CBC with  Differential/Platelet -     CMP14+EGFR -     US PELVIS (TRANSABDOMINAL ONLY); Future -     Pregnancy, urine -     Fluconazole; Take 1 tablet (150 mg total) by mouth daily for 5 days. At onset of symptoms. Repeat at end of treatment  Dispense: 5 tablet; Refill: 0  Muscle spasm -     CMP14+EGFR -     Potassium Chloride Crys ER; Take 1 tablet (10 mEq total) by mouth daily. As a potassium supplement  Dispense: 30 tablet; Refill: 2     Follow-up: Return in about 20 days (around 08/07/2023) for with Dr. Nadine Counts.  Mechele Claude, M.D.

## 2023-07-19 LAB — CMP14+EGFR
ALT: 9 IU/L (ref 0–32)
AST: 13 IU/L (ref 0–40)
Albumin: 4.2 g/dL (ref 4.0–5.0)
Alkaline Phosphatase: 102 IU/L (ref 42–106)
BUN/Creatinine Ratio: 14 (ref 9–23)
BUN: 11 mg/dL (ref 6–20)
Bilirubin Total: 0.2 mg/dL (ref 0.0–1.2)
CO2: 22 mmol/L (ref 20–29)
Calcium: 9.5 mg/dL (ref 8.7–10.2)
Chloride: 105 mmol/L (ref 96–106)
Creatinine, Ser: 0.79 mg/dL (ref 0.57–1.00)
Globulin, Total: 2.2 g/dL (ref 1.5–4.5)
Glucose: 81 mg/dL (ref 70–99)
Potassium: 4.5 mmol/L (ref 3.5–5.2)
Sodium: 140 mmol/L (ref 134–144)
Total Protein: 6.4 g/dL (ref 6.0–8.5)
eGFR: 110 mL/min/{1.73_m2} (ref >59)

## 2023-07-19 LAB — CBC WITH DIFFERENTIAL/PLATELET
Basophils Absolute: 0.1 10*3/uL (ref 0.0–0.2)
Basos: 1 %
EOS (ABSOLUTE): 0.2 10*3/uL (ref 0.0–0.4)
Eos: 2 %
Hematocrit: 37.1 % (ref 34.0–46.6)
Hemoglobin: 12 g/dL (ref 11.1–15.9)
Immature Grans (Abs): 0 10*3/uL (ref 0.0–0.1)
Immature Granulocytes: 0 %
Lymphocytes Absolute: 2.6 10*3/uL (ref 0.7–3.1)
Lymphs: 34 %
MCH: 27.3 pg (ref 26.6–33.0)
MCHC: 32.3 g/dL (ref 31.5–35.7)
MCV: 85 fL (ref 79–97)
Monocytes Absolute: 0.5 10*3/uL (ref 0.1–0.9)
Monocytes: 7 %
Neutrophils Absolute: 4.3 10*3/uL (ref 1.4–7.0)
Neutrophils: 56 %
Platelets: 269 10*3/uL (ref 150–450)
RBC: 4.39 x10E6/uL (ref 3.77–5.28)
RDW: 12.7 % (ref 11.7–15.4)
WBC: 7.7 10*3/uL (ref 3.4–10.8)

## 2023-07-19 LAB — CT NG M GENITALIUM NAA, URINE
Chlamydia trachomatis, NAA: NEGATIVE
Mycoplasma genitalium NAA: NEGATIVE
Neisseria gonorrhoeae, NAA: NEGATIVE

## 2023-08-01 ENCOUNTER — Other Ambulatory Visit: Payer: Self-pay | Admitting: Family Medicine

## 2023-08-01 DIAGNOSIS — N921 Excessive and frequent menstruation with irregular cycle: Secondary | ICD-10-CM

## 2023-08-07 ENCOUNTER — Ambulatory Visit: Admitting: Family Medicine

## 2023-08-07 ENCOUNTER — Encounter: Payer: Self-pay | Admitting: Family Medicine

## 2023-08-07 ENCOUNTER — Ambulatory Visit

## 2023-08-07 VITALS — BP 104/65 | HR 81 | Temp 98.4°F | Ht 67.0 in | Wt 222.8 lb

## 2023-08-07 DIAGNOSIS — G8929 Other chronic pain: Secondary | ICD-10-CM

## 2023-08-07 DIAGNOSIS — M79645 Pain in left finger(s): Secondary | ICD-10-CM

## 2023-08-07 DIAGNOSIS — M79644 Pain in right finger(s): Secondary | ICD-10-CM

## 2023-08-07 DIAGNOSIS — Z720 Tobacco use: Secondary | ICD-10-CM

## 2023-08-07 DIAGNOSIS — N939 Abnormal uterine and vaginal bleeding, unspecified: Secondary | ICD-10-CM

## 2023-08-07 MED ORDER — VARENICLINE TARTRATE 1 MG PO TABS: 1.0000 mg | ORAL_TABLET | Freq: Two times a day (BID) | ORAL | 1 refills | Status: AC

## 2023-08-07 MED ORDER — VARENICLINE TARTRATE (STARTER) 0.5 MG X 11 & 1 MG X 42 PO TBPK: ORAL_TABLET | ORAL | 0 refills | Status: AC

## 2023-08-07 NOTE — Progress Notes (Signed)
 Subjective: CC: Follow-up pain pain PCP: Eliodoro Guerin, DO VWU:[id number] Kari Randall is a 23 y.o. female presenting to clinic today for:  1.  Bilateral thumb pain Patient reports that she gets intense cramping in the thenar eminences of bilateral hands.  This typically occurs daily and can last up to 30 minutes per episode.  She works at [employer] locally and does a lot of of repetitive movements with her hands and thumbs.  She reports symptoms have been refractory to OTC management.  She also notes that she had hypokalemia and they thought perhaps this may be contributing but when they reviewed her labs her potassium actually had normalized.  2.  Tobacco use She vapes 5% weekly.  She is not quite sure the amount of nicotine that in this but she thinks it is the highest dose.  Would like to quit because she is starting to family-planning  3.  Abnormal uterine bleeding She has been off of her Depo-Provera  for about a year now.  They were aiming to family plan but her menstrual cycles have been erratic.  She also wants to stop smoking as above before starting a new family.  She was advised to maybe consider iron  tablets during her menstrual cycle but she was not sure what to get   ROS: Per HPI  Allergies  Allergen Reactions   Amitriptyline  Rash        Amoxicillin Diarrhea and Rash   Past Medical History:  Diagnosis Date   Anxiety    Attention deficit hyperactivity disorder (ADHD)    Depression     Current Outpatient Medications:    diclofenac  Sodium (VOLTAREN ) 1 % GEL, Apply 4 g topically 4 (four) times daily as needed (knee pain). (Patient not taking: Reported on 07/18/2023), Disp: 300 g, Rfl: 2   Galcanezumab -gnlm (EMGALITY ) 120 MG/ML SOAJ, Inject 120 mg into the skin every 28 (twenty-eight) days. Start Month #2 for migraine prevention (Patient not taking: Reported on 07/18/2023), Disp: 1.12 mL, Rfl: PRN   levocetirizine (XYZAL ) 5 MG tablet, Take 1 tablet (5 mg total) by mouth  every evening. For allergies/ ears (Patient not taking: Reported on 07/18/2023), Disp: 90 tablet, Rfl: 3   medroxyPROGESTERone  (DEPO-PROVERA ) 150 MG/ML injection, Inject 150 mg into the muscle every 3 (three) months. (Patient not taking: Reported on 07/18/2023), Disp: , Rfl:    metoCLOPramide  (REGLAN ) 10 MG tablet, TAKE 1 TABLET BY MOUTH EVERY 8 HOURS AS NEEDED FOR RECURRENT HEADACHES FOR UP TO 3 DAYS. TAKE IN CONJUNCTION WITH BENADRYL  50MG  AT THE EARLIEST SIGN OF ANY RECURRENT HEACACHES FOR UP TO 3 TIMES DAILY EVERY 8 HOURS APART AS NEEDED (Patient not taking: Reported on 07/18/2023), Disp: , Rfl:    MY WAY 1.5 MG tablet, Take 1.5 mg by mouth once. prn (Patient not taking: Reported on 07/18/2023), Disp: , Rfl:    Naproxen  375 MG TBEC, Take 1 tablet (375 mg total) by mouth 2 (two) times daily as needed (knee pain). (Patient not taking: Reported on 07/18/2023), Disp: 60 tablet, Rfl: 2   ondansetron  (ZOFRAN -ODT) 4 MG disintegrating tablet, Take 1 tablet (4 mg total) by mouth every 8 (eight) hours as needed for nausea or vomiting. (Patient not taking: Reported on 07/18/2023), Disp: 20 tablet, Rfl: 0   potassium chloride  SA (KLOR-CON  M) 10 MEQ tablet, Take 1 tablet (10 mEq total) by mouth daily. As a potassium supplement, Disp: 30 tablet, Rfl: 2   Rimegepant Sulfate (NURTEC) 75 MG TBDP, Take 1 tablet (75 mg total)  by mouth every other day. For migraine prevention (Patient not taking: Reported on 07/18/2023), Disp: 16 tablet, Rfl: 12   SUMAtriptan  (IMITREX ) 50 MG tablet, Take 1 tablet (50 mg total) by mouth every 2 (two) hours as needed for migraine. May repeat ONCE in 2 hours if headache persists or recurs. (Patient not taking: Reported on 07/18/2023), Disp: 10 tablet, Rfl: 0 Social History   Socioeconomic History   Marital status: Single    Spouse name: Not on file   Number of children: Not on file   Years of education: Not on file   Highest education level: Not on file  Occupational History   Not on file  Tobacco  Use   Smoking status: Never    Passive exposure: Yes   Smokeless tobacco: Never   Tobacco comments:    1-2 cigarettes a day   Vaping Use   Vaping status: Former   Substances: Nicotine  Substance and Sexual Activity   Alcohol use: No   Drug use: Not Currently    Types: Marijuana    Comment: last smoked- before preg   Sexual activity: Not Currently    Birth control/protection: None  Other Topics Concern   Not on file  Social History Narrative   Not on file   Social Drivers of Health   Financial Resource Strain: Low Risk  (08/04/2019)   Overall Financial Resource Strain (CARDIA)    Difficulty of Paying Living Expenses: Not very hard  Food Insecurity: No Food Insecurity (08/04/2019)   Hunger Vital Sign    Worried About Running Out of Food in the Last Year: Never true    Ran Out of Food in the Last Year: Never true  Transportation Needs: No Transportation Needs (08/04/2019)   PRAPARE - Administrator, Civil Service (Medical): No    Lack of Transportation (Non-Medical): No  Physical Activity: Sufficiently Active (08/04/2019)   Exercise Vital Sign    Days of Exercise per Week: 5 days    Minutes of Exercise per Session: 30 min  Stress: No Stress Concern Present (08/04/2019)   [employer] of Occupational Health - Occupational Stress Questionnaire    Feeling of Stress : Only a little  Social Connections: Unknown (12/30/2021)   [facility]   Social Network    Social Network: Not on file  Intimate Partner Violence: Unknown (12/30/2021)   Received from [facility]   HITS    Physically Hurt: Not on file    Insult or Talk Down To: Not on file    Threaten Physical Harm: Not on file    Scream or Curse: Not on file   Family History  Problem Relation Age of Onset   Thyroid  disease Mother    Anemia Mother     Objective: Office vital signs reviewed. BP 104/65   Pulse 81   Temp 98.4 F (36.9 C)   Ht 5\' 7"  (1.702 m)   Wt 222 lb 12.8 oz (101.1 kg)    LMP 07/28/2023 (Approximate)   SpO2 95%   BMI 34.90 kg/m   Physical Examination:  General: Awake, alert, well nourished, No acute distress HEENT: Sclera white.  No conjunctival pallor Cardio: regular rate and rhythm, S1S2 heard, no murmurs appreciated Pulm: clear to auscultation bilaterally, no wheezes, rhonchi or rales; normal work of breathing on room air MSK: She has no gross joint swelling, erythema or warmth noted at the MCP, PIP or DIP of the first digit.  She has no tenderness palpation.  No pain elicited with range of motion exercises    08/07/2023    3:15 PM 07/18/2023    2:58 PM 04/07/2022    1:35 PM  Depression screen PHQ 2/9  Decreased Interest 1 1 1   Down, Depressed, Hopeless 1 1 0  PHQ - 2 Score 2 2 1   Altered sleeping 3 3 0  Tired, decreased energy 3 3 1   Change in appetite 3 3 0  Feeling bad or failure about yourself  1 2 1   Trouble concentrating 0 3 0  Moving slowly or fidgety/restless 0 1 0  Suicidal thoughts 0 0 0  PHQ-9 Score 12 17 3   Difficult doing work/chores Not difficult at all Very difficult Somewhat difficult      08/07/2023    3:14 PM 07/18/2023    2:58 PM 04/07/2022    1:35 PM 10/24/2021    2:01 PM  GAD 7 : Generalized Anxiety Score  Nervous, Anxious, on Edge 1 0 1 1  Control/stop worrying 1 1 0 3  Worry too much - different things 2 2 0 3  Trouble relaxing 3 2 0 1  Restless 2 0 0 0  Easily annoyed or irritable 1 3 2 3   Afraid - awful might happen 1 1 0 0  Total GAD 7 Score 11 9 3 11   Anxiety Difficulty Somewhat difficult Not difficult at all Somewhat difficult Somewhat difficult    Assessment/ Plan: 23 y.o. female   Chronic thumb pain, bilateral - Plan: Ambulatory referral to Orthopedic Surgery  Tobacco use - Plan: Varenicline Tartrate, Starter, (CHANTIX STARTING MONTH PAK) 0.5 MG X 11 & 1 MG X 42 TBPK, varenicline (CHANTIX CONTINUING MONTH PAK) 1 MG tablet  Abnormal uterine bleeding (AUB)  Referral to orthopedics.  I think this is  likely an overuse injury.  We discussed may benefit from physical therapy.  Icing.  Chantix for smoking cessation.  Will use a backup method of contraception whilst on medicine  May need to consider eval with OB/GYN for abnormal uterine bleeding but I suspect this is due to chronic use of Depo shot in the past   Kinston Magnan M Naythan Douthit, DO [facility] [phone]

## 2023-08-17 ENCOUNTER — Encounter: Payer: Self-pay | Admitting: Family Medicine

## 2023-08-20 ENCOUNTER — Telehealth (INDEPENDENT_AMBULATORY_CARE_PROVIDER_SITE_OTHER): Admitting: Family

## 2023-08-20 ENCOUNTER — Encounter: Payer: Self-pay | Admitting: Family

## 2023-08-20 DIAGNOSIS — G43101 Migraine with aura, not intractable, with status migrainosus: Secondary | ICD-10-CM | POA: Diagnosis not present

## 2023-08-20 MED ORDER — SUMATRIPTAN SUCCINATE 50 MG PO TABS: 50.0000 mg | ORAL_TABLET | ORAL | 2 refills | Status: AC | PRN

## 2023-08-20 MED ORDER — ONDANSETRON HCL 4 MG PO TABS: 4.0000 mg | ORAL_TABLET | Freq: Three times a day (TID) | ORAL | 2 refills | Status: AC | PRN

## 2023-08-20 MED ORDER — TOPIRAMATE 25 MG PO TABS: 25.0000 mg | ORAL_TABLET | Freq: Two times a day (BID) | ORAL | 2 refills | Status: AC

## 2023-08-20 NOTE — Progress Notes (Signed)
 Virtual Visit Consent   Kari Randall, you are scheduled for a virtual visit with a [facility] provider today. Just as with appointments in the office, your consent must be obtained to participate. Your consent will be active for this visit and any virtual visit you may have with one of our providers in the next 365 days. If you have a MyChart account, a copy of this consent can be sent to you electronically.  As this is a virtual visit, video technology does not allow for your provider to perform a traditional examination. This may limit your provider's ability to fully assess your condition. If your provider identifies any concerns that need to be evaluated in person or the need to arrange testing (such as labs, EKG, etc.), we will make arrangements to do so. Although advances in technology are sophisticated, we cannot ensure that it will always work on either your end or our end. If the connection with a video visit is poor, the visit may have to be switched to a telephone visit. With either a video or telephone visit, we are not always able to ensure that we have a secure connection.  By engaging in this virtual visit, you consent to the provision of healthcare and authorize for your insurance to be billed (if applicable) for the services provided during this visit. Depending on your insurance coverage, you may receive a charge related to this service.  I need to obtain your verbal consent now. Are you willing to proceed with your visit today? Kari Randall has provided verbal consent on 08/20/2023 for a virtual visit (video or telephone). Tommas Fragmin, FNP  Date: 08/20/2023 3:24 PM   Virtual Visit via Video Note   I, Tommas Fragmin, connected with  Kari Randall  ([id number], [birth year]) on 08/20/23 at  2:10 PM EDT by a video-enabled telemedicine application and verified that I am speaking with the correct person using two identifiers.  Location: Patient: Virtual Visit Location Patient:  Car Provider: Virtual Visit Location Provider: Home Office   I discussed the limitations of evaluation and management by telemedicine and the availability of in person appointments. The patient expressed understanding and agreed to proceed.    History of Present Illness: Kari Randall is a 23 y.o. who identifies as a female who was assigned female at birth, and is being seen today for migraine daily. Reports she has had theses since she was in second grade on and off. Reports she went years without one, but over the last year they have increased. Reports certain lights and smells will trigger one.   HPI: Migraine  This is a chronic problem. The current episode started more than 1 year ago. The problem occurs daily. The problem has been unchanged. The pain is located in the Frontal region. The pain quality is similar to prior headaches. The quality of the pain is described as aching and shooting. The pain is at a severity of 5/10. The pain is moderate. Associated symptoms include nausea, phonophobia and photophobia. Pertinent negatives include no vomiting. The symptoms are aggravated by noise and bright light. She has tried Excedrin for the symptoms. The treatment provided mild relief. Her past medical history is significant for migraine headaches.    Problems:  Patient Active Problem List   Diagnosis Date Noted   Cesarean delivery delivered 02/06/2020   Supervision of high-risk pregnancy 02/06/2020   Encounter for IUD insertion 02/06/2020   Anemia affecting pregnancy in third trimester 12/06/2019   Abnormal  chromosomal and genetic finding on antenatal screening mother 08/19/2019   GBS bacteriuria 08/07/2019   Smoker 08/04/2019   Supervision of normal first teen pregnancy 07/30/2019   Obesity (BMI 30-39.9) 05/07/2018   Anxiety 05/07/2018   Attention deficit hyperactivity disorder (ADHD), 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   Depression 05/19/2016    Allergies:  Allergies  Allergen  Reactions   Amitriptyline  Rash        Amoxicillin Diarrhea and Rash   Medications:  Current Outpatient Medications:    ondansetron  (ZOFRAN ) 4 MG tablet, Take 1 tablet (4 mg total) by mouth every 8 (eight) hours as needed for nausea or vomiting., Disp: 20 tablet, Rfl: 2   SUMAtriptan  (IMITREX ) 50 MG tablet, Take 1 tablet (50 mg total) by mouth every 2 (two) hours as needed for migraine. May repeat in 2 hours if headache persists or recurs., Disp: 10 tablet, Rfl: 2   topiramate (TOPAMAX) 25 MG tablet, Take 1 tablet (25 mg total) by mouth 2 (two) times daily., Disp: 60 tablet, Rfl: 2   potassium chloride  SA (KLOR-CON  M) 10 MEQ tablet, Take 1 tablet (10 mEq total) by mouth daily. As a potassium supplement, Disp: 30 tablet, Rfl: 2   varenicline  (CHANTIX  CONTINUING MONTH PAK) 1 MG tablet, Take 1 tablet (1 mg total) by mouth 2 (two) times daily. Start month #2, Disp: 180 tablet, Rfl: 1   Varenicline  Tartrate, Starter, (CHANTIX  STARTING MONTH PAK) 0.5 MG X 11 & 1 MG X 42 TBPK, Take 0.5 mg tablet by mouth once daily x3 days, then 0.5 mg tablet twice daily x4 days, then increase to one 1 mg tablet twice daily., Disp: 53 each, Rfl: 0  Observations/Objective: Patient is well-developed, well-nourished in no acute distress.  Resting comfortably Head is normocephalic, atraumatic.  No labored breathing.  Speech is clear and coherent with logical content.  Patient is alert and oriented at baseline.    Assessment and Plan: 1. Migraine with aura and with status migrainosus, not intractable (Primary) - topiramate (TOPAMAX) 25 MG tablet; Take 1 tablet (25 mg total) by mouth 2 (two) times daily.  Dispense: 60 tablet; Refill: 2 - SUMAtriptan  (IMITREX ) 50 MG tablet; Take 1 tablet (50 mg total) by mouth every 2 (two) hours as needed for migraine. May repeat in 2 hours if headache persists or recurs.  Dispense: 10 tablet; Refill: 2 - ondansetron  (ZOFRAN ) 4 MG tablet; Take 1 tablet (4 mg total) by mouth every 8  (eight) hours as needed for nausea or vomiting.  Dispense: 20 tablet; Refill: 2  Start Topamax 25 mg BID for 2 weeks then increase to 50 mg BID Imitrex  and zofran  as needed Stress management  Encourage 8 hours of rest  Follow up in 2 months   Follow Up Instructions: I discussed the assessment and treatment plan with the patient. The patient was provided an opportunity to ask questions and all were answered. The patient agreed with the plan and demonstrated an understanding of the instructions.  A copy of instructions were sent to the patient via MyChart unless otherwise noted below.     The patient was advised to call back or seek an in-person evaluation if the symptoms worsen or if the condition fails to improve as anticipated.    Tommas Fragmin, FNP

## 2023-08-20 NOTE — Patient Instructions (Signed)

## 2023-08-28 ENCOUNTER — Other Ambulatory Visit: Payer: Self-pay | Admitting: Family Medicine

## 2023-08-28 DIAGNOSIS — N921 Excessive and frequent menstruation with irregular cycle: Secondary | ICD-10-CM

## 2023-08-29 ENCOUNTER — Ambulatory Visit (HOSPITAL_COMMUNITY)

## 2023-09-06 DIAGNOSIS — R070 Pain in throat: Secondary | ICD-10-CM | POA: Diagnosis not present

## 2023-09-06 DIAGNOSIS — Z20822 Contact with and (suspected) exposure to covid-19: Secondary | ICD-10-CM | POA: Diagnosis not present

## 2023-09-13 ENCOUNTER — Ambulatory Visit (HOSPITAL_COMMUNITY): Attending: Family Medicine

## 2023-09-20 ENCOUNTER — Ambulatory Visit: Admitting: Family Medicine

## 2023-09-24 ENCOUNTER — Encounter: Payer: Self-pay | Admitting: Family Medicine

## 2023-10-19 ENCOUNTER — Ambulatory Visit: Admitting: Family

## 2023-10-23 ENCOUNTER — Encounter: Payer: Self-pay | Admitting: Family Medicine

## 2023-12-11 ENCOUNTER — Encounter: Admitting: Family Medicine
